# Patient Record
Sex: Male | Born: 2000 | Race: Black or African American | Hispanic: No | Marital: Single
Health system: Southern US, Community
[De-identification: ages and names within clinical notes are randomized; demographics above are authoritative.]

---

## 2020-06-03 ENCOUNTER — Emergency Department (HOSPITAL_COMMUNITY): Payer: Managed Care, Other (non HMO)

## 2020-06-03 ENCOUNTER — Encounter (HOSPITAL_COMMUNITY): Payer: Self-pay

## 2020-06-03 ENCOUNTER — Emergency Department (HOSPITAL_COMMUNITY)
Admission: EM | Admit: 2020-06-03 | Discharge: 2020-06-04 | Disposition: A | Discharge status: Home / Self Care | Payer: Managed Care, Other (non HMO) | Attending: Emergency Medicine | Admitting: Emergency Medicine

## 2020-06-03 ENCOUNTER — Other Ambulatory Visit: Payer: Self-pay

## 2020-06-03 DIAGNOSIS — S99911A Unspecified injury of right ankle, initial encounter: Secondary | ICD-10-CM | POA: Diagnosis present

## 2020-06-03 DIAGNOSIS — S93401A Sprain of unspecified ligament of right ankle, initial encounter: Secondary | ICD-10-CM | POA: Diagnosis not present

## 2020-06-03 DIAGNOSIS — Y9367 Activity, basketball: Secondary | ICD-10-CM | POA: Insufficient documentation

## 2020-06-03 MED ORDER — IBUPROFEN 400 MG PO TABS
600.0000 mg | ORAL_TABLET | Freq: Once | ORAL | Status: AC
  Administered 2020-06-04: 600 mg via ORAL
  Filled 2020-06-03: qty 1

## 2020-06-03 NOTE — ED Provider Notes (Incomplete)
  [facility] EMERGENCY DEPARTMENT Provider Note   CSN: [id number] Arrival date & time: 06/03/20  1902     History Chief Complaint  Patient presents with  . Ankle Pain    Michael Knox Cervi. is a 20 y.o. male.  Michael Raynaldo Falco. is a 20 y.o. male who is otherwise healthy, presents to the ED for evaluation of right ankle injury.  He reports earlier today he was playing basketball and rolled his right ankle and has had pain and swelling since then.  No previous history of ankle injuries or surgeries.  No numbness or weakness.  No numbness or skin changes.  No other aggravating or alleviating factors. No meds prior to arrival. No other injuries.        History reviewed. No pertinent past medical history.  There are no problems to display for this patient.   History reviewed. No pertinent surgical history.     No family history on file.     Home Medications Prior to Admission medications   Not on File    Allergies    Patient has no known allergies.  Review of Systems   Review of Systems  Constitutional: Negative for chills and fever.  Musculoskeletal: Positive for arthralgias and joint swelling.  Skin: Negative for color change and rash.  Neurological: Negative for weakness and numbness.  All other systems reviewed and are negative.   Physical Exam Updated Vital Signs BP 119/79 (BP Location: Left Arm)   Pulse 73   Temp 98.6 F (37 C) (Oral)   Resp 16   SpO2 99%   Physical Exam  ED Results / Procedures / Treatments   Labs (all labs ordered are listed, but only abnormal results are displayed) Labs Reviewed - No data to display  EKG None  Radiology DG Ankle Complete Right  Result Date: 06/03/2020 CLINICAL DATA:  Fall, ankle pain after basketball EXAM: RIGHT ANKLE - COMPLETE 3+ VIEW COMPARISON:  None. FINDINGS: Focal soft tissue swelling noted over the lateral malleolus with a small ankle joint effusion. No clear acute fracture or  traumatic malalignment is seen. Small bony excrescence along the dorsal aspect of the anterior talar process appears corticated and likely remote. Could correlate point tenderness. No other conspicuous osseous abnormalities. Remaining soft tissues are unremarkable. IMPRESSION: Lateral ankle swelling. Bony excrescence along the dorsal aspect of the anterior talar process is favored to be remote though could correlate for point tenderness. No convincing acute fracture is seen. Electronically Signed   By: Kreg Shropshire M.D.   On: 06/03/2020 20:39    Procedures Procedures {Remember to document critical care time when appropriate:1}  Medications Ordered in ED Medications - No data to display  ED Course  I have reviewed the triage vital signs and the nursing notes.  Pertinent labs & imaging results that were available during my care of the patient were reviewed by me and considered in my medical decision making (see chart for details).    MDM Rules/Calculators/A&P                          *** Final Clinical Impression(s) / ED Diagnoses Final diagnoses:  None    Rx / DC Orders ED Discharge Orders    None

## 2020-06-03 NOTE — ED Triage Notes (Signed)
Pt states that he was playing basketball and rolled his R ankle, swelling noted.

## 2020-06-03 NOTE — ED Provider Notes (Signed)
Emergency Medicine Provider Triage Evaluation Note  Michael Hoover. , a 20 y.o. male  was evaluated in triage.  Pt complains of R ankle pain while playing basketball. No other injuries. No foot pain. Reports pain with walking.  Review of Systems  Positive: R ankle pain Negative: Foot pain  Physical Exam  BP 119/79 (BP Location: Left Arm)   Pulse 73   Temp 98.6 F (37 C) (Oral)   Resp 16   SpO2 99%  Gen:   Awake, no distress   Resp:  Normal effort  MSK:   Moves extremities without difficulty  Other:  TTP and swelling of R ankle  Medical Decision Making  Medically screening exam initiated at 7:56 PM.  Appropriate orders placed.  [place] Kreg Hoover. was informed that the remainder of the evaluation will be completed by another provider, this initial triage assessment does not replace that evaluation, and the importance of remaining in the ED until their evaluation is complete.  Xray ordered.   Dietrich Pates, PA-C 06/03/20 1957    Wynetta Fines, MD 06/03/20 [phone]

## 2020-06-03 NOTE — Discharge Instructions (Signed)
Symptoms likely due to ankle sprain.  Your x-ray did not show any obvious fracture but did show a slight bony abnormality to the talus.  Use cam walker for extra protection, crutches, but you can bear weight as tolerated.  Ice and elevate the ankle and use ibuprofen and Tylenol for pain.  Follow-up with orthopedics or sports medicine if symptoms not improving.

## 2020-06-03 NOTE — ED Provider Notes (Signed)
[facility] EMERGENCY DEPARTMENT Provider Note   CSN: [id number] Arrival date & time: 06/03/20  1902     History Chief Complaint  Patient presents with  . Ankle Pain    Michael Hoover. is a 20 y.o. male.  Michael Hoover. is a 20 y.o. male who is otherwise healthy, presents to the ED for evaluation of right ankle injury.  He reports earlier today he was playing basketball and rolled his right ankle and has had pain and swelling since then.  No previous history of ankle injuries or surgeries.  No numbness or weakness.  No numbness or skin changes.  No other aggravating or alleviating factors. No meds prior to arrival. No other injuries.        History reviewed. No pertinent past medical history.  There are no problems to display for this patient.   History reviewed. No pertinent surgical history.     No family history on file.     Home Medications Prior to Admission medications   Not on File    Allergies    Patient has no known allergies.  Review of Systems   Review of Systems  Constitutional: Negative for chills and fever.  Musculoskeletal: Positive for arthralgias and joint swelling.  Skin: Negative for color change and rash.  Neurological: Negative for weakness and numbness.  All other systems reviewed and are negative.   Physical Exam Updated Vital Signs BP 119/79 (BP Location: Left Arm)   Pulse 73   Temp 98.6 F (37 C) (Oral)   Resp 16   SpO2 99%   Physical Exam Vitals and nursing note reviewed.  Constitutional:      General: He is not in acute distress.    Appearance: Normal appearance. He is well-developed. He is not ill-appearing or diaphoretic.  HENT:     Head: Normocephalic and atraumatic.  Eyes:     General:        Right eye: No discharge.        Left eye: No discharge.  Pulmonary:     Effort: Pulmonary effort is normal. No respiratory distress.  Musculoskeletal:     Comments: There is swelling and tenderness  over the medial and lateral malleoli and top of the proximal foot, no tenderness over the heel. No overt deformity.The fifth metatarsal is not tender. The ankle joint is intact without excessive opening on stressing.   Skin:    General: Skin is warm and dry.  Neurological:     Mental Status: He is alert and oriented to person, place, and time.     Coordination: Coordination normal.  Psychiatric:        Mood and Affect: Mood normal.        Behavior: Behavior normal.     ED Results / Procedures / Treatments   Labs (all labs ordered are listed, but only abnormal results are displayed) Labs Reviewed - No data to display  EKG None  Radiology DG Ankle Complete Right  Result Date: 06/03/2020 CLINICAL DATA:  Fall, ankle pain after basketball EXAM: RIGHT ANKLE - COMPLETE 3+ VIEW COMPARISON:  None. FINDINGS: Focal soft tissue swelling noted over the lateral malleolus with a small ankle joint effusion. No clear acute fracture or traumatic malalignment is seen. Small bony excrescence along the dorsal aspect of the anterior talar process appears corticated and likely remote. Could correlate point tenderness. No other conspicuous osseous abnormalities. Remaining soft tissues are unremarkable. IMPRESSION: Lateral ankle swelling. Bony excrescence along  the dorsal aspect of the anterior talar process is favored to be remote though could correlate for point tenderness. No convincing acute fracture is seen. Electronically Signed   By: Kreg Shropshire M.D.   On: 06/03/2020 20:39    Procedures Procedures   Medications Ordered in ED Medications  ibuprofen (ADVIL) tablet 600 mg (600 mg Oral Given 06/04/20 0000)    ED Course  I have reviewed the triage vital signs and the nursing notes.  Pertinent labs & imaging results that were available during my care of the patient were reviewed by me and considered in my medical decision making (see chart for details).    MDM Rules/Calculators/A&P                          Presentation consistent with ankle sprain. Tenderness and swelling over medial and lateral malleolus, pt is neurovascularly intact. X-ray reviewed by myself, agree with radiologist findings, negative for obvious fracture and ankle mortise is intact in the bony abnormality this which could be acute, versus remote injury, patient with no known prior ankle injury.. Pain treated in the ED. Pt placed in cam boot for extra protection given abnormality to the talus and provided crutches, ambulated without difficulty. Pt stable for discharge home with NSAIDs & RICE therapy. Pt to follow-up with ortho in one week if symptoms not improving. Return precautions discussed, Pt expresses understanding and agrees with plan.   Final Clinical Impression(s) / ED Diagnoses Final diagnoses:  Sprain of right ankle, unspecified ligament, initial encounter    Rx / DC Orders ED Discharge Orders    None       Legrand Rams 06/04/20 0006    Shon Baton, MD 06/04/20 [phone]

## 2022-06-11 IMAGING — CR DG ANKLE COMPLETE 3+V*R*
3 series · 3 of 3 positions shown · non-contrast
Comparison: None.

CLINICAL DATA: Fall, ankle pain after basketball

EXAM:
RIGHT ANKLE - COMPLETE 3+ VIEW

[ankle ap]
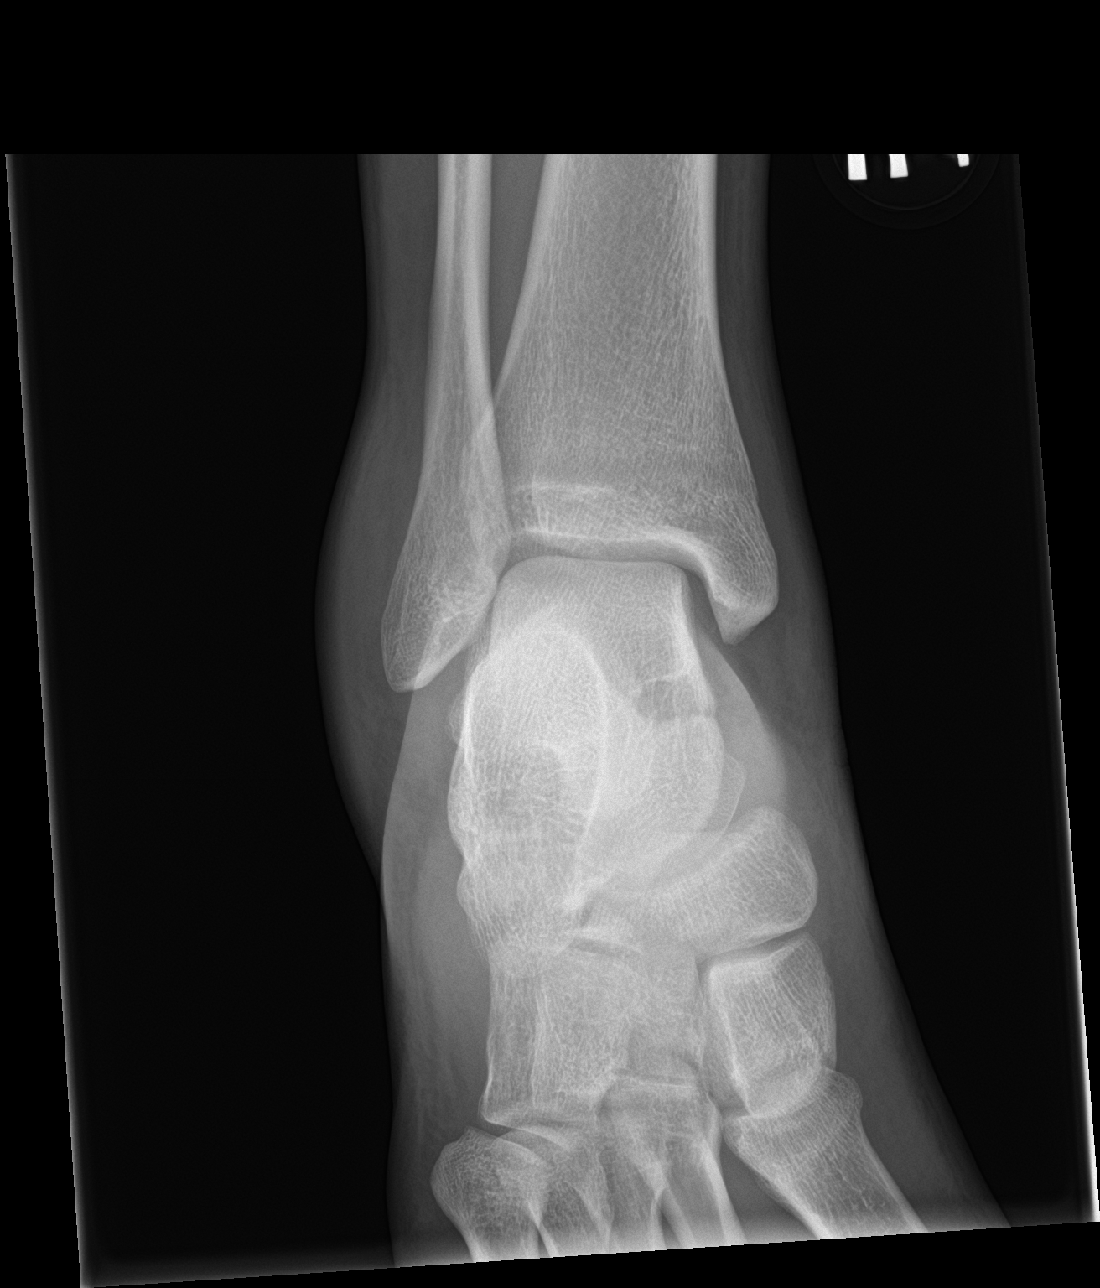

[ankle obl]
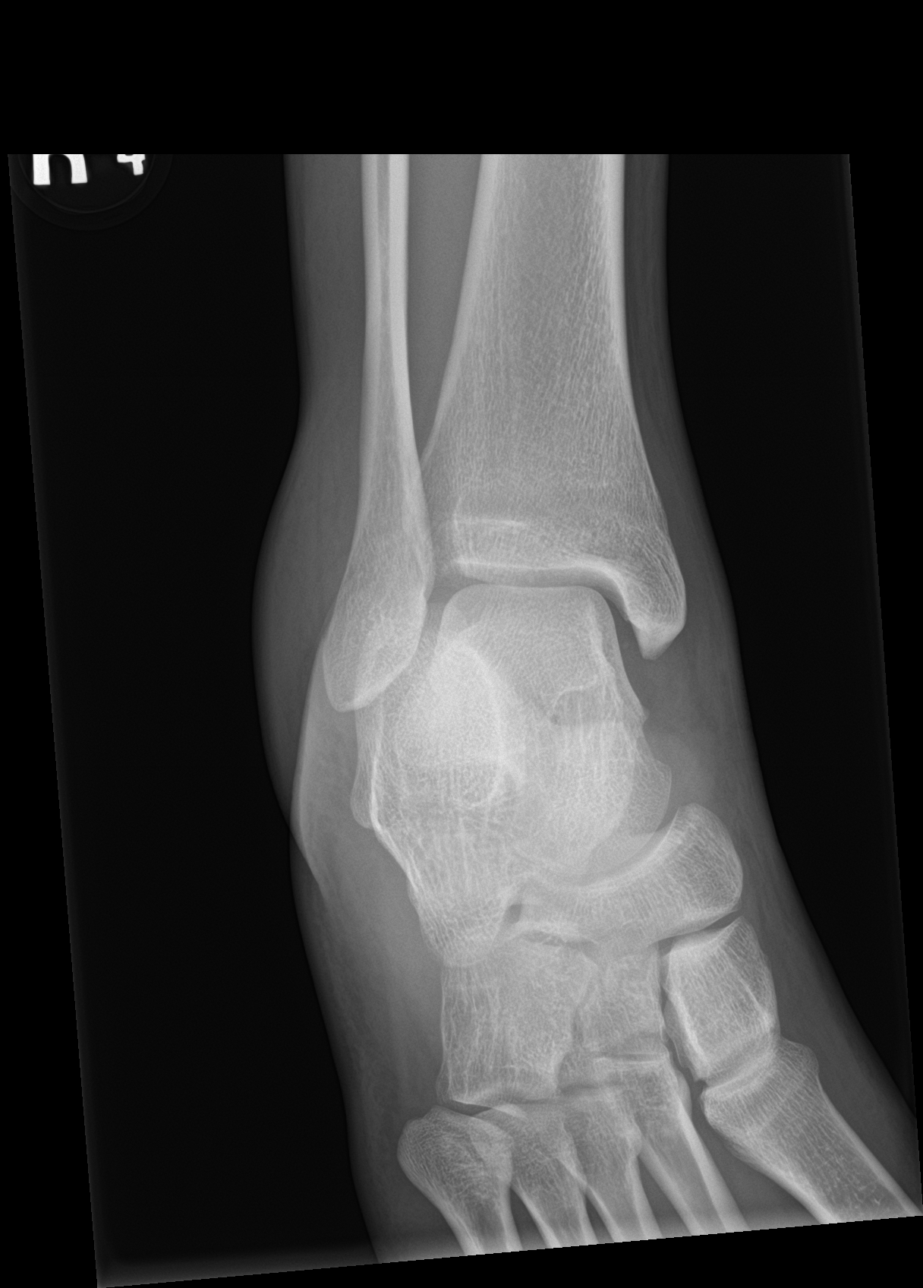

[ankle lat]
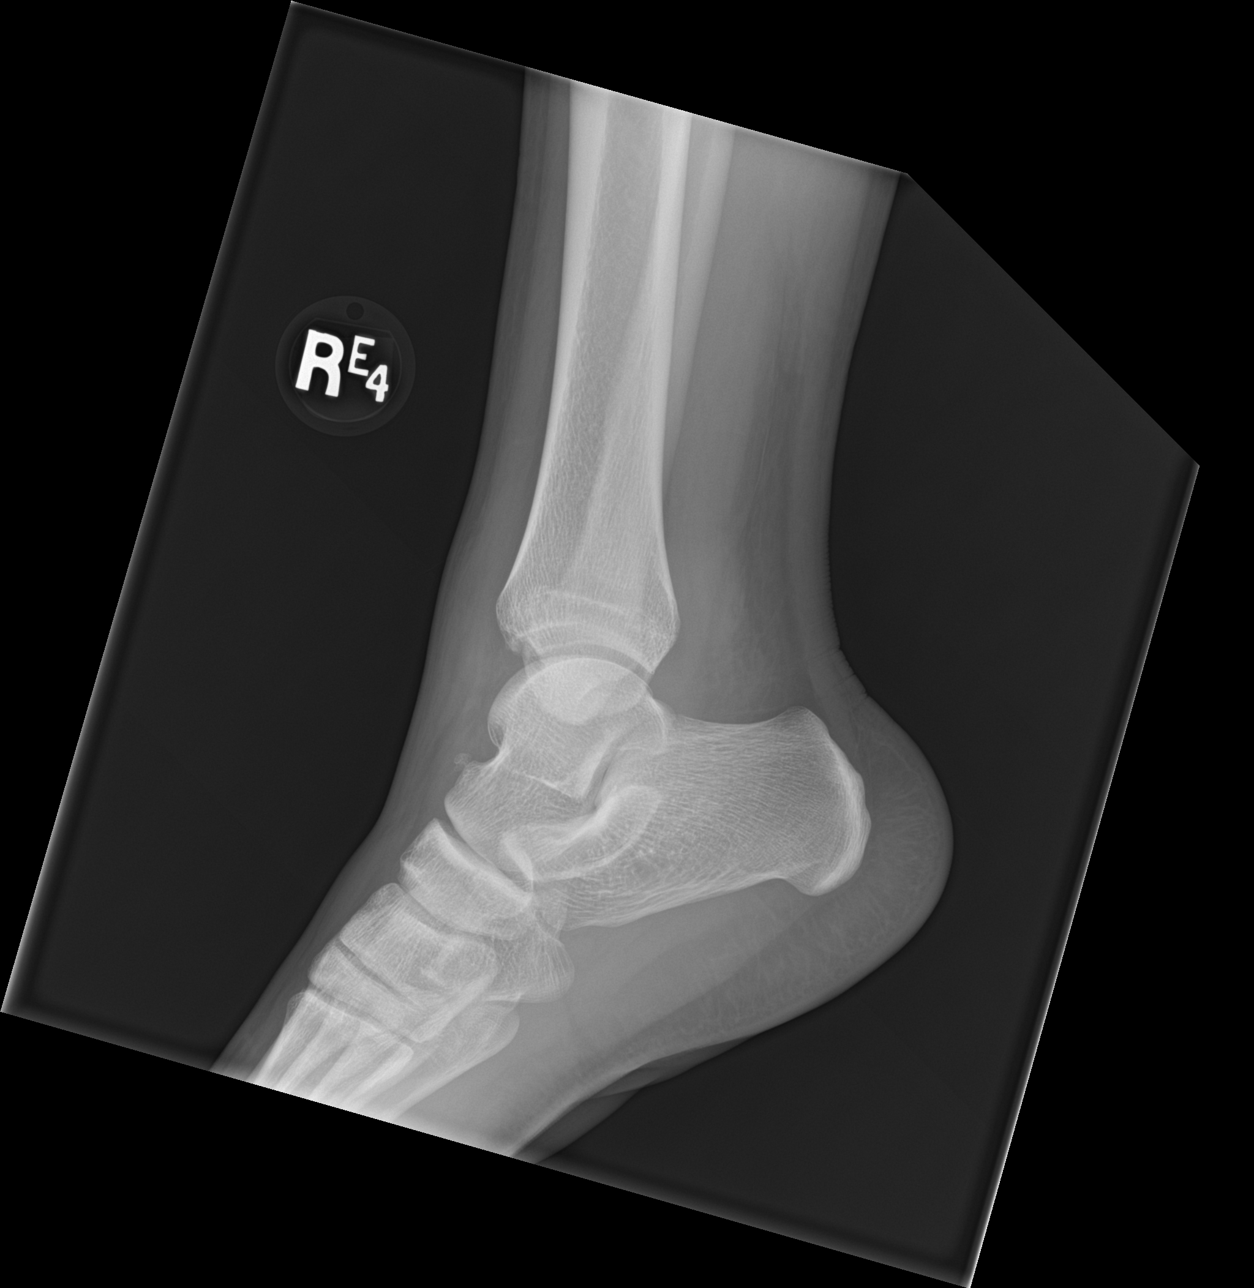

[3 of 3 positions shown; findings below may reference images not displayed]

FINDINGS: Focal soft tissue swelling noted over the lateral malleolus with a
small ankle joint effusion. No clear acute fracture or traumatic
malalignment is seen. Small bony excrescence along the dorsal aspect
of the anterior talar process appears corticated and likely remote.
Could correlate point tenderness. No other conspicuous osseous
abnormalities. Remaining soft tissues are unremarkable.
IMPRESSION: Lateral ankle swelling.

Bony excrescence along the dorsal aspect of the anterior talar
process is favored to be remote though could correlate for point
tenderness.

No convincing acute fracture is seen.
